# Patient Record
Sex: Male | Born: 1990 | Race: White | Hispanic: Yes | Marital: Single | Smoking: Current every day smoker
Health system: Southern US, Community
[De-identification: ages and names within clinical notes are randomized; demographics above are authoritative.]

---

## 2013-08-06 ENCOUNTER — Emergency Department (HOSPITAL_COMMUNITY): Payer: Self-pay

## 2013-08-06 ENCOUNTER — Emergency Department (HOSPITAL_COMMUNITY)
Admission: EM | Admit: 2013-08-06 | Discharge: 2013-08-07 | Disposition: A | Discharge status: Home / Self Care | Payer: Self-pay | Attending: Emergency Medicine | Admitting: Emergency Medicine

## 2013-08-06 ENCOUNTER — Encounter (HOSPITAL_COMMUNITY): Payer: Self-pay | Admitting: Emergency Medicine

## 2013-08-06 DIAGNOSIS — R05 Cough: Secondary | ICD-10-CM | POA: Insufficient documentation

## 2013-08-06 DIAGNOSIS — R059 Cough, unspecified: Secondary | ICD-10-CM | POA: Insufficient documentation

## 2013-08-06 DIAGNOSIS — R091 Pleurisy: Secondary | ICD-10-CM | POA: Insufficient documentation

## 2013-08-06 DIAGNOSIS — F17200 Nicotine dependence, unspecified, uncomplicated: Secondary | ICD-10-CM | POA: Insufficient documentation

## 2013-08-06 DIAGNOSIS — R0789 Other chest pain: Secondary | ICD-10-CM | POA: Insufficient documentation

## 2013-08-06 LAB — CBC WITH DIFFERENTIAL/PLATELET
Basophils Absolute: 0 10*3/uL (ref 0.0–0.1)
Eosinophils Absolute: 0.2 10*3/uL (ref 0.0–0.7)
Eosinophils Relative: 3 % (ref 0–5)
Lymphocytes Relative: 56 % — ABNORMAL HIGH (ref 12–46)
Lymphs Abs: 3.3 10*3/uL (ref 0.7–4.0)
MCH: 30.7 pg (ref 26.0–34.0)
MCHC: 35.6 g/dL (ref 30.0–36.0)
MCV: 86.4 fL (ref 78.0–100.0)
Monocytes Relative: 7 % (ref 3–12)
Neutrophils Relative %: 34 % — ABNORMAL LOW (ref 43–77)
Platelets: 254 10*3/uL (ref 150–400)
RBC: 4.69 MIL/uL (ref 4.22–5.81)
WBC: 5.9 10*3/uL (ref 4.0–10.5)

## 2013-08-06 LAB — BASIC METABOLIC PANEL
BUN: 9 mg/dL (ref 6–23)
CO2: 24 mEq/L (ref 19–32)
Chloride: 106 mEq/L (ref 96–112)
GFR calc non Af Amer: >90 mL/min (ref >90)
Glucose, Bld: 87 mg/dL (ref 70–99)
Potassium: 4 mEq/L (ref 3.5–5.1)
Sodium: 143 mEq/L (ref 135–145)

## 2013-08-06 NOTE — ED Notes (Signed)
Presents with SOB that began 3 months ago associated with a cough and pain with inspiration on the left side. Pt states, "It feels like I can not take a deep breath and when I do it makes me a little light headed and dizzy" rhonchi auscultated in left lower lung fields. Cough was productive but over the last 2 days he has been unable to cough anything up.  "I feel like I can't breathe and then I get hot and sweaty and feel like I am going to pass out but I fight it"

## 2013-08-06 NOTE — ED Provider Notes (Signed)
CSN: [id number]     Arrival date & time 08/06/13  2221 History   First MD Initiated Contact with Patient 08/06/13 2323     Chief Complaint  Patient presents with  . Shortness of Breath   (Consider location/radiation/quality/duration/timing/severity/associated sxs/prior Treatment) HPI Comments: Patient reports "feeling like I can't get a deep breath" for the past 2 months.  It is associated with cough and L lateral rib pain, worse with deep breathing.  Tonight while sitting on the couch, he had an episode of feeling like he couldn't catch his breath and his friends got scared because they thought he was going to pass out.  Cough is nonproductive. No fever.  No abdominal pain, nausea, or vomiting.  Moved from [place] 4 months ago.  No leg pain or swelling.  Current smoker.No LOC today, no hyperventilation or syncope.  The history is provided by the patient.    History reviewed. No pertinent past medical history. History reviewed. No pertinent past surgical history. History reviewed. No pertinent family history. History  Substance Use Topics  . Smoking status: Current Every Day Smoker    Types: Cigarettes  . Smokeless tobacco: Not on file  . Alcohol Use: Yes    Review of Systems  Constitutional: Negative for fever, activity change and appetite change.  HENT: Negative for rhinorrhea.   Respiratory: Positive for cough, chest tightness and shortness of breath.   Cardiovascular: Negative for chest pain.  Gastrointestinal: Negative for nausea, vomiting and abdominal pain.  Genitourinary: Negative for dysuria and hematuria.  Musculoskeletal: Negative for back pain.  Skin: Negative for rash.  Neurological: Negative for dizziness, weakness and headaches.  A complete 10 system review of systems was obtained and all systems are negative except as noted in the HPI and PMH.    Allergies  Shellfish allergy  Home Medications   Current Outpatient Rx  Name  Route  Sig  Dispense  Refill  .  ibuprofen (ADVIL,MOTRIN) 800 MG tablet   Oral   Take 1 tablet (800 mg total) by mouth 3 (three) times daily.   21 tablet   0    BP 109/58  Pulse 74  Temp(Src) 98.4 F (36.9 C) (Oral)  Resp 21  SpO2 96% Physical Exam  Constitutional: He is oriented to person, place, and time. He appears well-developed and well-nourished.  Appears anxious  HENT:  Head: Normocephalic and atraumatic.  Mouth/Throat: Oropharynx is clear and moist.  Eyes: Conjunctivae and EOM are normal. Pupils are equal, round, and reactive to light.  Neck: Normal range of motion. Neck supple.  Cardiovascular: Normal rate, regular rhythm and normal heart sounds.   No murmur heard. Pulmonary/Chest: Effort normal and breath sounds normal. No respiratory distress. He exhibits tenderness.  L lateral rib tenderness, no ecchymosis, no crepitance.  Abdominal: Soft. There is no tenderness. There is no rebound and no guarding.  Musculoskeletal: Normal range of motion. He exhibits no edema and no tenderness.  Neurological: He is alert and oriented to person, place, and time. No cranial nerve deficit. He exhibits normal muscle tone. Coordination normal.  Skin: Skin is warm.    ED Course  Procedures (including critical care time) Labs Review Labs Reviewed  CBC WITH DIFFERENTIAL - Abnormal; Notable for the following:    Neutrophils Relative % 34 (*)    Lymphocytes Relative 56 (*)    All other components within normal limits  BASIC METABOLIC PANEL  PRO B NATRIURETIC PEPTIDE  TROPONIN I  D-DIMER, QUANTITATIVE   Imaging Review Dg  Chest 2 View (if Patient Has Fever And/or Copd)  08/06/2013   *RADIOLOGY REPORT*  Clinical Data: Shortness of breath.  CHEST - 2 VIEW  Comparison: None available at time of study interpretation.  Findings: The cardiomediastinal silhouette is unremarkable.  The lungs are clear without pleural effusions or focal consolidations. The pulmonary vasculature is unremarkable.   Trachea projects midline and  there is no pneumothorax.  The included soft tissue planes and osseous structures are unremarkable.  IMPRESSION: No acute cardiopulmonary process.   Original Report Authenticated By: Awilda Metro    MDM   1. Pleurisy    SOB and L sided chest pain that has been ongoing for the past several months, worse tonight.   Chest x-ray is negative. D-dimer is negative. Low suspicion for PE or ACS. Patient advised to refrain from sports or other exertional activities until followup with PCP.  Suspect the patient's pain is secondary pleurisy with possible anxiety component. Vitals are stable in the ED. No tachycardia or hypoxia. D-dimer negative. Low suspicion for PE. We'll treat with anti-inflammatories and followup with PCP.   Date: 08/06/2013  Rate: 92  Rhythm: normal sinus rhythm  QRS Axis: normal  Intervals: normal  ST/T Wave abnormalities: normal  Conduction Disutrbances:right bundle branch block  Narrative Interpretation:   Old EKG Reviewed: none available and unchanged    Glynn Octave, MD 08/07/13 0120

## 2013-08-07 LAB — D-DIMER, QUANTITATIVE: D-Dimer, Quant: <0.27 ug/mL-FEU (ref 0.00–0.48)

## 2013-08-07 LAB — PRO B NATRIURETIC PEPTIDE: Pro B Natriuretic peptide (BNP): <5 pg/mL (ref 0–125)

## 2013-08-07 LAB — TROPONIN I: Troponin I: <0.3 ng/mL (ref <0.30)

## 2013-08-07 MED ORDER — IBUPROFEN 800 MG PO TABS: 800.0000 mg | ORAL_TABLET | Freq: Three times a day (TID) | ORAL | Status: DC

## 2013-08-07 MED ORDER — LORAZEPAM 1 MG PO TABS
1.0000 mg | ORAL_TABLET | Freq: Once | ORAL | Status: DC
  Filled 2013-08-07: qty 1

## 2013-08-07 MED ORDER — IBUPROFEN 800 MG PO TABS
800.0000 mg | ORAL_TABLET | Freq: Once | ORAL | Status: AC
  Administered 2013-08-07: 800 mg via ORAL
  Filled 2013-08-07: qty 1

## 2015-10-08 ENCOUNTER — Emergency Department (HOSPITAL_COMMUNITY): Payer: Self-pay

## 2015-10-08 ENCOUNTER — Encounter (HOSPITAL_COMMUNITY): Payer: Self-pay | Admitting: Emergency Medicine

## 2015-10-08 ENCOUNTER — Emergency Department (HOSPITAL_COMMUNITY)
Admission: EM | Admit: 2015-10-08 | Discharge: 2015-10-08 | Disposition: A | Discharge status: Home / Self Care | Payer: Self-pay | Attending: Emergency Medicine | Admitting: Emergency Medicine

## 2015-10-08 DIAGNOSIS — Y9289 Other specified places as the place of occurrence of the external cause: Secondary | ICD-10-CM | POA: Insufficient documentation

## 2015-10-08 DIAGNOSIS — W1789XA Other fall from one level to another, initial encounter: Secondary | ICD-10-CM | POA: Insufficient documentation

## 2015-10-08 DIAGNOSIS — Y990 Civilian activity done for income or pay: Secondary | ICD-10-CM | POA: Insufficient documentation

## 2015-10-08 DIAGNOSIS — M79672 Pain in left foot: Secondary | ICD-10-CM

## 2015-10-08 DIAGNOSIS — F17210 Nicotine dependence, cigarettes, uncomplicated: Secondary | ICD-10-CM | POA: Insufficient documentation

## 2015-10-08 DIAGNOSIS — Y9389 Activity, other specified: Secondary | ICD-10-CM | POA: Insufficient documentation

## 2015-10-08 DIAGNOSIS — S9032XA Contusion of left foot, initial encounter: Secondary | ICD-10-CM | POA: Insufficient documentation

## 2015-10-08 MED ORDER — IBUPROFEN 400 MG PO TABS
800.0000 mg | ORAL_TABLET | Freq: Once | ORAL | Status: AC
  Administered 2015-10-08: 800 mg via ORAL
  Filled 2015-10-08: qty 2

## 2015-10-08 MED ORDER — NAPROXEN 500 MG PO TABS: 500.0000 mg | ORAL_TABLET | Freq: Two times a day (BID) | ORAL | Status: DC

## 2015-10-08 NOTE — ED Notes (Addendum)
Pt in room calling for a cab at this time.

## 2015-10-08 NOTE — ED Notes (Signed)
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 Pt c/o pain. No obvious deformity

## 2015-10-08 NOTE — ED Provider Notes (Signed)
CSN: [id number]     Arrival date & time 10/08/15  4098 History  By signing my name below, I, Charles Spencer, attest that this documentation has been prepared under the direction and in the presence of Sharilyn Sites, PA-C Electronically Signed: Soijett Spencer, ED Scribe. 10/08/2015. 7:57 PM.   Chief Complaint  Patient presents with  . Foot Pain      The history is provided by the patient. No language interpreter was used.    Charles Spencer is a 24 y.o. male who presents to the Emergency Department complaining of left foot pain occurring PTA. 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 He reports that he was trying to cover the dump truck with tarp at the time of the incident and he was wearing tennis shoes at the time. No head injury or LOC.  Pt is having associated symptoms of left foot swelling, bruising to the left foot. He notes that he has tried ice with no relief of his symptoms. He denies wound, rash, gait problem, and any other symptoms.    History reviewed. No pertinent past medical history. History reviewed. No pertinent past surgical history. No family history on file. Social History  Substance Use Topics  . Smoking status: Current Every Day Smoker    Types: Cigarettes  . Smokeless tobacco: None  . Alcohol Use: Yes    Review of Systems  Musculoskeletal: Positive for joint swelling and arthralgias. Negative for gait problem.  Skin: Positive for color change (bruising). Negative for rash and wound.  All other systems reviewed and are negative.     Allergies  Shellfish allergy and Tylenol  Home Medications   Prior to Admission medications   Medication Sig Start Date End Date Taking? Authorizing Provider  ibuprofen (ADVIL,MOTRIN) 800 MG tablet Take 1 tablet (800 mg total) by mouth 3 (three) times daily. 08/07/13   Glynn Octave, MD   BP 138/94 mmHg  Pulse 94  Temp(Src) 98.3 F (36.8 C) (Oral)  Resp 16  SpO2 99%    Physical Exam  Constitutional: He is oriented to person, place, and time. He appears well-developed and well-nourished.  HENT:  Head: Normocephalic and atraumatic.  Mouth/Throat: Oropharynx is clear and moist.  Eyes: Conjunctivae and EOM are normal. Pupils are equal, round, and reactive to light.  Neck: Normal range of motion.  Cardiovascular: Normal rate, regular rhythm and normal heart sounds.   Pulmonary/Chest: Effort normal and breath sounds normal.  Abdominal: Soft. Bowel sounds are normal.  Musculoskeletal: Normal range of motion.       Left foot: There is tenderness, bony tenderness and swelling (mild). There is normal range of motion, no crepitus, no deformity and no laceration.       Feet:  Bruising noted to medial aspect of left heel; small amount of swelling; no bony deformities; foot is NVI  Neurological: He is alert and oriented to person, place, and time.  Skin: Skin is warm and dry.  Psychiatric: He has a normal mood and affect.  Nursing note and vitals reviewed.   ED Course  Procedures (including critical care time) DIAGNOSTIC STUDIES: Oxygen Saturation is 99% on RA, nl by my interpretation.    COORDINATION OF CARE: 7:56 PM Discussed treatment plan with pt at bedside which includes left ankle and left foot xray and pt agreed to plan.    Labs Review Labs Reviewed - No data to display  Imaging Review Dg Ankle Complete  Left  10/08/2015  CLINICAL DATA:  24 year old who fell from a dump truck earlier today and injured the left foot and ankle. Initial encounter. EXAM: LEFT ANKLE COMPLETE - 3+ VIEW COMPARISON:  None. FINDINGS: No evidence of acute fracture or dislocation. Ankle mortise intact with well preserved joint space. Well preserved bone mineral density. Bone island in the distal tibial metaphysis. No visible joint effusion. IMPRESSION: No acute or significant osseous abnormality. Electronically Signed   By: Hulan Saashomas  Lawrence M.D.   On: 10/08/2015 19:11   Dg  Foot Complete Left  10/08/2015  CLINICAL DATA:  24 year old who fell off of a dump truck earlier today and injured the left foot and ankle. Initial encounter. EXAM: LEFT FOOT - COMPLETE 3+ VIEW COMPARISON:  None. FINDINGS: No evidence of acute fracture or dislocation. Well preserved joint spaces. Well preserved bone mineral density. Small bone island in the distal calcaneus. Very small plantar calcaneal spur. IMPRESSION: No acute or significant osseous abnormality. Electronically Signed   By: Hulan Saashomas  Lawrence M.D.   On: 10/08/2015 19:27   I have personally reviewed and evaluated these images as part of my medical decision-making.   EKG Interpretation None      MDM   Final diagnoses:  Left foot pain   24 rolled male here with left foot pain after falling out of a dump truck. No head injury or loss of consciousness. Complains of pain surrounding left heel. He does have some mild swelling and bruising on exam without acute deformity. Foot is neurovascularly intact. X-rays were obtained which are negative for acute findings. Ace wrap applied and crutches given. Discharge home with supportive care.  Discussed plan with patient, he/she acknowledged understanding and agreed with plan of care.  Return precautions given for new or worsening symptoms.  I personally performed the services described in this documentation, which was scribed in my presence. The recorded information has been reviewed and is accurate.  Charles HatchetLisa M Josedejesus Marcum, PA-C 10/08/15 2047  Charles DibblesJon Knapp, MD 10/09/15 [id number]

## 2015-10-08 NOTE — Discharge Instructions (Signed)

## 2015-10-08 NOTE — ED Notes (Signed)
Pt picked up from radiology from Triage.  Will bring to room when xrays complete

## 2015-10-08 NOTE — ED Notes (Signed)
Pt able to ambulate to lobby with crutches with no issues.

## 2016-03-30 ENCOUNTER — Encounter (HOSPITAL_COMMUNITY): Payer: Self-pay | Admitting: Emergency Medicine

## 2016-03-30 ENCOUNTER — Emergency Department (HOSPITAL_COMMUNITY): Payer: Self-pay

## 2016-03-30 ENCOUNTER — Emergency Department (HOSPITAL_COMMUNITY)
Admission: EM | Admit: 2016-03-30 | Discharge: 2016-03-30 | Disposition: A | Discharge status: Home / Self Care | Payer: Self-pay | Attending: Emergency Medicine | Admitting: Emergency Medicine

## 2016-03-30 DIAGNOSIS — S80212A Abrasion, left knee, initial encounter: Secondary | ICD-10-CM | POA: Insufficient documentation

## 2016-03-30 DIAGNOSIS — S0512XA Contusion of eyeball and orbital tissues, left eye, initial encounter: Secondary | ICD-10-CM | POA: Insufficient documentation

## 2016-03-30 DIAGNOSIS — S060X1A Concussion with loss of consciousness of 30 minutes or less, initial encounter: Secondary | ICD-10-CM

## 2016-03-30 DIAGNOSIS — F10920 Alcohol use, unspecified with intoxication, uncomplicated: Secondary | ICD-10-CM

## 2016-03-30 DIAGNOSIS — S0511XA Contusion of eyeball and orbital tissues, right eye, initial encounter: Secondary | ICD-10-CM | POA: Insufficient documentation

## 2016-03-30 DIAGNOSIS — Y999 Unspecified external cause status: Secondary | ICD-10-CM | POA: Insufficient documentation

## 2016-03-30 DIAGNOSIS — T148XXA Other injury of unspecified body region, initial encounter: Secondary | ICD-10-CM

## 2016-03-30 DIAGNOSIS — Y92410 Unspecified street and highway as the place of occurrence of the external cause: Secondary | ICD-10-CM | POA: Insufficient documentation

## 2016-03-30 DIAGNOSIS — S022XXA Fracture of nasal bones, initial encounter for closed fracture: Secondary | ICD-10-CM | POA: Insufficient documentation

## 2016-03-30 DIAGNOSIS — F10129 Alcohol abuse with intoxication, unspecified: Secondary | ICD-10-CM | POA: Insufficient documentation

## 2016-03-30 DIAGNOSIS — F17210 Nicotine dependence, cigarettes, uncomplicated: Secondary | ICD-10-CM | POA: Insufficient documentation

## 2016-03-30 DIAGNOSIS — Y9301 Activity, walking, marching and hiking: Secondary | ICD-10-CM | POA: Insufficient documentation

## 2016-03-30 LAB — CBC WITH DIFFERENTIAL/PLATELET
BASOS PCT: 0 %
Basophils Absolute: 0 10*3/uL (ref 0.0–0.1)
EOS ABS: 0 10*3/uL (ref 0.0–0.7)
EOS PCT: 0 %
HCT: 40.1 % (ref 39.0–52.0)
HEMOGLOBIN: 13.4 g/dL (ref 13.0–17.0)
LYMPHS ABS: 1.4 10*3/uL (ref 0.7–4.0)
Lymphocytes Relative: 15 %
MCH: 29.8 pg (ref 26.0–34.0)
MCHC: 33.4 g/dL (ref 30.0–36.0)
MCV: 89.1 fL (ref 78.0–100.0)
MONO ABS: 0.7 10*3/uL (ref 0.1–1.0)
MONOS PCT: 7 %
NEUTROS PCT: 78 %
Neutro Abs: 7.5 10*3/uL (ref 1.7–7.7)
Platelets: 249 10*3/uL (ref 150–400)
RBC: 4.5 MIL/uL (ref 4.22–5.81)
RDW: 12.6 % (ref 11.5–15.5)
WBC: 9.7 10*3/uL (ref 4.0–10.5)

## 2016-03-30 LAB — COMPREHENSIVE METABOLIC PANEL
ALBUMIN: 4.1 g/dL (ref 3.5–5.0)
ALK PHOS: 48 U/L (ref 38–126)
ALT: 22 U/L (ref 17–63)
ANION GAP: 11 (ref 5–15)
AST: 57 U/L — ABNORMAL HIGH (ref 15–41)
BUN: 11 mg/dL (ref 6–20)
CALCIUM: 8.5 mg/dL — AB (ref 8.9–10.3)
CO2: 22 mmol/L (ref 22–32)
Chloride: 105 mmol/L (ref 101–111)
Creatinine, Ser: 0.9 mg/dL (ref 0.61–1.24)
GFR calc non Af Amer: >60 mL/min (ref >60)
Glucose, Bld: 104 mg/dL — ABNORMAL HIGH (ref 65–99)
POTASSIUM: 4 mmol/L (ref 3.5–5.1)
SODIUM: 138 mmol/L (ref 135–145)
TOTAL PROTEIN: 6.8 g/dL (ref 6.5–8.1)
Total Bilirubin: 0.9 mg/dL (ref 0.3–1.2)

## 2016-03-30 LAB — ETHANOL: Alcohol, Ethyl (B): 183 mg/dL — ABNORMAL HIGH (ref <5)

## 2016-03-30 MED ORDER — IBUPROFEN 800 MG PO TABS: 800.0000 mg | ORAL_TABLET | Freq: Three times a day (TID) | ORAL | Status: AC

## 2016-03-30 MED ORDER — ONDANSETRON HCL 4 MG PO TABS: 4.0000 mg | ORAL_TABLET | Freq: Four times a day (QID) | ORAL | Status: AC

## 2016-03-30 MED ORDER — MORPHINE SULFATE (PF) 4 MG/ML IV SOLN
4.0000 mg | Freq: Once | INTRAVENOUS | Status: AC
  Administered 2016-03-30: 4 mg via INTRAMUSCULAR
  Filled 2016-03-30: qty 1

## 2016-03-30 MED ORDER — OXYCODONE-ACETAMINOPHEN 5-325 MG PO TABS: 1.0000 | ORAL_TABLET | Freq: Four times a day (QID) | ORAL | Status: AC | PRN

## 2016-03-30 MED ORDER — ONDANSETRON 4 MG PO TBDP
4.0000 mg | ORAL_TABLET | Freq: Once | ORAL | Status: AC
  Administered 2016-03-30: 4 mg via ORAL
  Filled 2016-03-30: qty 1

## 2016-03-30 NOTE — ED Notes (Signed)
Passed PO challenge. Continues to sleep.

## 2016-03-30 NOTE — ED Notes (Signed)
Pt ambulatory without difficulty. Gait steady.
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
## 2017-06-13 ENCOUNTER — Emergency Department (HOSPITAL_COMMUNITY)
Admission: EM | Admit: 2017-06-13 | Discharge: 2017-06-13 | Disposition: A | Discharge status: Home / Self Care | Payer: Self-pay | Attending: Emergency Medicine | Admitting: Emergency Medicine

## 2017-06-13 ENCOUNTER — Encounter (HOSPITAL_COMMUNITY): Payer: Self-pay | Admitting: *Deleted

## 2017-06-13 DIAGNOSIS — R0602 Shortness of breath: Secondary | ICD-10-CM | POA: Insufficient documentation

## 2017-06-13 DIAGNOSIS — R55 Syncope and collapse: Secondary | ICD-10-CM | POA: Insufficient documentation

## 2017-06-13 LAB — BASIC METABOLIC PANEL
ANION GAP: 8 (ref 5–15)
BUN: 7 mg/dL (ref 6–20)
CALCIUM: 9.2 mg/dL (ref 8.9–10.3)
CO2: 28 mmol/L (ref 22–32)
CREATININE: 0.77 mg/dL (ref 0.61–1.24)
Chloride: 103 mmol/L (ref 101–111)
GLUCOSE: 103 mg/dL — AB (ref 65–99)
Potassium: 3.5 mmol/L (ref 3.5–5.1)
Sodium: 139 mmol/L (ref 135–145)

## 2017-06-13 LAB — CBC
HCT: 37.7 % — ABNORMAL LOW (ref 39.0–52.0)
HEMOGLOBIN: 12.7 g/dL — AB (ref 13.0–17.0)
MCH: 30.5 pg (ref 26.0–34.0)
MCHC: 33.7 g/dL (ref 30.0–36.0)
MCV: 90.6 fL (ref 78.0–100.0)
PLATELETS: 219 10*3/uL (ref 150–400)
RBC: 4.16 MIL/uL — ABNORMAL LOW (ref 4.22–5.81)
RDW: 12.9 % (ref 11.5–15.5)
WBC: 3.7 10*3/uL — ABNORMAL LOW (ref 4.0–10.5)

## 2017-06-13 NOTE — ED Notes (Signed)
Called pt for room no answer 

## 2017-06-13 NOTE — ED Provider Notes (Signed)
Nursing reports they have called the patient but did not respond in waiting room. They will be trying to locate the patient see if he has left the emergency department. At this time, I have not been able to see the patient.   Arby BarrettePfeiffer, Tuwanda Vokes, MD 06/17/17 [phone]

## 2017-06-13 NOTE — ED Notes (Signed)
This RN called pt's name in lobby x 2, once over loudspeaker, no answer.

## 2017-06-13 NOTE — ED Notes (Signed)
Multiple calls for pt with no answer

## 2017-06-13 NOTE — ED Triage Notes (Signed)
Pt reports having sob and episode of feeling near syncopal this am while at work. No acute distress is noted at triage.
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
